# Patient Record
Sex: Female | Born: 2000 | Race: Black or African American | Hispanic: No | Marital: Single | Smoking: Never smoker
Health system: Southern US, Community
[De-identification: ages and names within clinical notes are randomized; demographics above are authoritative.]

## PROBLEM LIST (undated history)

## (undated) DIAGNOSIS — R1030 Lower abdominal pain, unspecified: Secondary | ICD-10-CM

## (undated) DIAGNOSIS — J45909 Unspecified asthma, uncomplicated: Secondary | ICD-10-CM

## (undated) HISTORY — PX: TONSILLECTOMY: SUR1361

## (undated) HISTORY — DX: Lower abdominal pain, unspecified: R10.30

## (undated) HISTORY — PX: TYMPANOSTOMY TUBE PLACEMENT: SHX32

## (undated) HISTORY — PX: APPENDECTOMY: SHX54

---
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
## 2019-01-23 ENCOUNTER — Ambulatory Visit: Payer: Medicaid Other | Admitting: Pediatrics

## 2019-01-26 ENCOUNTER — Encounter: Payer: Self-pay | Admitting: Pediatrics

## 2019-01-26 ENCOUNTER — Other Ambulatory Visit: Payer: Self-pay

## 2019-01-26 ENCOUNTER — Ambulatory Visit (INDEPENDENT_AMBULATORY_CARE_PROVIDER_SITE_OTHER): Payer: Medicaid Other | Admitting: Pediatrics

## 2019-01-26 VITALS — BP 112/72 | HR 73 | Ht 63.15 in | Wt 207.0 lb

## 2019-01-26 DIAGNOSIS — R29898 Other symptoms and signs involving the musculoskeletal system: Secondary | ICD-10-CM | POA: Diagnosis not present

## 2019-01-26 NOTE — Progress Notes (Signed)
Name: Denise Mullins Age: 19 y.o. Sex: female DOB: [birth year] MRN: [id number]  Chief Complaint  Patient presents with  . Neck Pain    Accompanied by mom Rollene Fare     HPI:  This is a 68 y.o. patient who has complaints of neck pain over the last 2 weeks.  She has had gradual onset of moderate severity pain in her neck.  Specifically, she has pain on the left trapezius region.  She states she has radiating pain that goes down her left arm.  Her pain is aggravated at bedtime.  Mom states she did have pain in her back that radiated down her leg, but that seems to have improved/resolved.   She states the pain seems to move around to different parts of her body.  Past Medical History:  Diagnosis Date  . Asthma   . Lower abdominal pain     Past Surgical History:  Procedure Laterality Date  . APPENDECTOMY    . TONSILLECTOMY    . TYMPANOSTOMY TUBE PLACEMENT       Family History  Problem Relation Age of Onset  . Obesity Mother   . Diabetes Father   . Hypertension Father   . Thyroid disease Paternal Grandmother     Current Outpatient Medications on File Prior to Visit  Medication Sig Dispense Refill  . albuterol (PROVENTIL HFA;VENTOLIN HFA) 108 (90 BASE) MCG/ACT inhaler Inhale 2 puffs into the lungs every 4 (four) hours as needed for wheezing or shortness of breath.     . fluticasone (FLONASE) 50 MCG/ACT nasal spray USE 2 SPRAYS INTRANASALLY TO BOTH NOSTRILS ONCE DAILY     No current facility-administered medications on file prior to visit.     ALLERGIES:   Allergies  Allergen Reactions  . Augmentin [Amoxicillin-Pot Clavulanate] Anaphylaxis    Review of Systems  Constitutional: Negative for fever and malaise/fatigue.  HENT: Negative for congestion, ear pain and sore throat.   Eyes: Negative for discharge and redness.  Respiratory: Negative for cough, shortness of breath and wheezing.   Cardiovascular: Negative for chest pain.  Gastrointestinal: Negative for abdominal  pain, diarrhea and vomiting.  Musculoskeletal: Negative for myalgias.  Skin: Negative for rash.  Neurological: Negative for dizziness and headaches.     OBJECTIVE:  VITALS: Blood pressure 112/72, pulse 73, height 5' 3.15" (1.604 m), weight 207 lb (93.9 kg), SpO2 99 %.   Body mass index is 36.49 kg/m.  98 %ile (Z= 2.03) based on CDC (Girls, 2-20 Years) BMI-for-age based on BMI available as of 01/26/2019.  Wt Readings from Last 3 Encounters:  01/26/19 207 lb (93.9 kg) (98 %, Z= 2.05)*  11/21/18 205 lb 3.2 oz (93.1 kg) (98 %, Z= 2.03)*  01/15/15 181 lb (82.1 kg) (97 %, Z= 1.90)*   * Growth percentiles are based on CDC (Girls, 2-20 Years) data.   Ht Readings from Last 3 Encounters:  01/26/19 5' 3.15" (1.604 m) (33 %, Z= -0.44)*  11/21/18 5' 3.19" (1.605 m) (34 %, Z= -0.42)*  01/15/15 5' 2.76" (1.594 m) (35 %, Z= -0.38)*   * Growth percentiles are based on CDC (Girls, 2-20 Years) data.     PHYSICAL EXAM:  General: The patient appears awake, alert, and in no acute distress.  Head: Head is atraumatic/normocephalic.  Ears: TMs are translucent bilaterally without erythema or bulging.  Eyes: No scleral icterus.  No conjunctival injection.  Nose: No nasal congestion noted. No nasal discharge is seen.  Mouth/Throat: Mouth is moist.  Throat without erythema,  lesions, or ulcers.  Neck: Supple without adenopathy.  Chest: Good expansion, symmetric, no deformities noted.  Heart: Regular rate with normal S1-S2.  Lungs: Clear to auscultation bilaterally without wheezes or crackles.  No respiratory distress, work of breathing, or tachypnea noted.  Abdomen: Soft, nontender, nondistended with normal active bowel sounds.  No rebound or guarding noted.  No masses palpated.  No organomegaly noted.  Skin: No rashes noted.  Extremities/Back: Full range of motion with no deficits noted.  Tightness of the left trapezius is noted.  No muscle atrophy is present.  Neurologic exam:  Musculoskeletal exam appropriate for age, normal strength, tone, and reflexes.   IN-HOUSE LABORATORY RESULTS: No results found for any visits on 01/26/19.   ASSESSMENT/PLAN:  1. Other symptoms and signs involving the musculoskeletal system Discussed with the family about this patient's neck pain.  It is likely she is having some muscle spasm, possibly from nerve impingement.  Discussed about ways to improve her pain.  Tylenol may be given, however Motrin is probably a superior choice.  This can be given as directed on the bottle.  Cycling x3 of ice for 20 minutes at a time with immediate subsequent application of a heating pad for 20 minutes may be performed.  Discussed with the family referral will be made to physical therapy.  It may be of limited utility to refer the patient to a spine doctor at this time.  However, if her pain continues, it may be necessary in the future.  Discussed with the family if the patient is not called within 1 week about the referral to physical therapy, the family should call back to the office for an update.  - Physical Therapy    Return if symptoms worsen or fail to improve.

## 2019-01-30 ENCOUNTER — Ambulatory Visit: Payer: Medicaid Other | Admitting: Pediatrics

## 2019-02-13 ENCOUNTER — Encounter: Payer: Self-pay | Admitting: Pediatrics

## 2019-02-16 ENCOUNTER — Ambulatory Visit (HOSPITAL_COMMUNITY): Payer: Medicaid Other | Attending: Pediatrics | Admitting: Physical Therapy

## 2019-03-17 DIAGNOSIS — Z03818 Encounter for observation for suspected exposure to other biological agents ruled out: Secondary | ICD-10-CM | POA: Diagnosis not present

## 2019-03-20 ENCOUNTER — Telehealth: Payer: Self-pay | Admitting: Pediatrics

## 2019-03-20 NOTE — Telephone Encounter (Signed)
Needs a graduation letter.  I'm seeing her on a couple of days.

## 2019-03-21 ENCOUNTER — Encounter: Payer: Self-pay | Admitting: Pediatrics

## 2019-03-21 NOTE — Telephone Encounter (Signed)
Graduation letter ready and at check-in for hand delivery at 03/23/19 appointment.

## 2019-03-23 ENCOUNTER — Ambulatory Visit: Payer: Medicaid Other | Admitting: Pediatrics

## 2019-05-24 DIAGNOSIS — Z13220 Encounter for screening for lipoid disorders: Secondary | ICD-10-CM | POA: Diagnosis not present

## 2019-05-24 DIAGNOSIS — R635 Abnormal weight gain: Secondary | ICD-10-CM | POA: Diagnosis not present

## 2019-05-24 DIAGNOSIS — N926 Irregular menstruation, unspecified: Secondary | ICD-10-CM | POA: Diagnosis not present

## 2019-05-24 DIAGNOSIS — Z68.41 Body mass index (BMI) pediatric, greater than or equal to 95th percentile for age: Secondary | ICD-10-CM | POA: Diagnosis not present

## 2019-05-24 DIAGNOSIS — Z6837 Body mass index (BMI) 37.0-37.9, adult: Secondary | ICD-10-CM | POA: Diagnosis not present

## 2019-05-24 DIAGNOSIS — Z0000 Encounter for general adult medical examination without abnormal findings: Secondary | ICD-10-CM | POA: Diagnosis not present

## 2019-07-11 DIAGNOSIS — Z23 Encounter for immunization: Secondary | ICD-10-CM | POA: Diagnosis not present

## 2019-07-17 ENCOUNTER — Encounter: Payer: Medicaid Other | Admitting: Adult Health

## 2019-08-08 DIAGNOSIS — Z23 Encounter for immunization: Secondary | ICD-10-CM | POA: Diagnosis not present

## 2019-12-28 DIAGNOSIS — N760 Acute vaginitis: Secondary | ICD-10-CM | POA: Diagnosis not present

## 2020-01-16 ENCOUNTER — Encounter (INDEPENDENT_AMBULATORY_CARE_PROVIDER_SITE_OTHER): Payer: Self-pay | Admitting: Student in an Organized Health Care Education/Training Program

## 2020-02-12 DIAGNOSIS — N926 Irregular menstruation, unspecified: Secondary | ICD-10-CM | POA: Diagnosis not present

## 2020-02-12 DIAGNOSIS — R350 Frequency of micturition: Secondary | ICD-10-CM | POA: Diagnosis not present

## 2020-03-02 DIAGNOSIS — E869 Volume depletion, unspecified: Secondary | ICD-10-CM | POA: Diagnosis not present

## 2020-03-02 DIAGNOSIS — J45909 Unspecified asthma, uncomplicated: Secondary | ICD-10-CM | POA: Diagnosis not present

## 2020-03-02 DIAGNOSIS — R109 Unspecified abdominal pain: Secondary | ICD-10-CM | POA: Diagnosis not present

## 2020-03-02 DIAGNOSIS — R1032 Left lower quadrant pain: Secondary | ICD-10-CM | POA: Diagnosis not present

## 2020-03-03 DIAGNOSIS — R109 Unspecified abdominal pain: Secondary | ICD-10-CM | POA: Diagnosis not present

## 2020-03-05 ENCOUNTER — Telehealth: Payer: Self-pay

## 2020-03-05 NOTE — Telephone Encounter (Signed)
Transition Care Management Follow-up Telephone Call  Date of discharge and from where: 03/03/2020 [facility] ED  How have you been since you were released from the hospital? Spoke with mother Fadumo Heng. Stated patient is feeling better and back in school.    Confirmed PCP is at [facility] Patient mother hung up mid-sentence. Attempted to call back no answer.

## 2020-03-18 DIAGNOSIS — Z202 Contact with and (suspected) exposure to infections with a predominantly sexual mode of transmission: Secondary | ICD-10-CM | POA: Diagnosis not present

## 2020-05-16 DIAGNOSIS — A6004 Herpesviral vulvovaginitis: Secondary | ICD-10-CM | POA: Diagnosis not present

## 2020-05-16 DIAGNOSIS — Z3202 Encounter for pregnancy test, result negative: Secondary | ICD-10-CM | POA: Diagnosis not present

## 2020-05-16 DIAGNOSIS — N760 Acute vaginitis: Secondary | ICD-10-CM | POA: Diagnosis not present

## 2020-05-16 DIAGNOSIS — B9689 Other specified bacterial agents as the cause of diseases classified elsewhere: Secondary | ICD-10-CM | POA: Diagnosis not present

## 2020-05-16 DIAGNOSIS — Z113 Encounter for screening for infections with a predominantly sexual mode of transmission: Secondary | ICD-10-CM | POA: Diagnosis not present

## 2020-05-22 ENCOUNTER — Other Ambulatory Visit: Payer: Self-pay

## 2020-05-22 ENCOUNTER — Other Ambulatory Visit (INDEPENDENT_AMBULATORY_CARE_PROVIDER_SITE_OTHER): Payer: Medicaid Other

## 2020-05-22 DIAGNOSIS — Z202 Contact with and (suspected) exposure to infections with a predominantly sexual mode of transmission: Secondary | ICD-10-CM

## 2020-05-22 NOTE — Progress Notes (Addendum)
   NURSE VISIT- VAGINITIS/STD/POC  SUBJECTIVE:  Denise Mullins is a 20 y.o. No obstetric history on file. GYN patientfemale here for a vaginal swab for STD screen.  She reports the following symptoms: bumps for 1 week. Denies abnormal vaginal bleeding, significant pelvic pain, fever, or UTI symptoms.  OBJECTIVE:  There were no vitals taken for this visit.  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 Discussed w/ nurse. States pt doesn't have any current bumps/vulvar lesions. Was seen recently at hospital and was told she had herpes and pt wanted bloodwork to be checked.  Cheral Marker, [place] 05/22/2020 5:16 PM

## 2020-05-23 DIAGNOSIS — J069 Acute upper respiratory infection, unspecified: Secondary | ICD-10-CM | POA: Diagnosis not present

## 2020-05-23 DIAGNOSIS — R42 Dizziness and giddiness: Secondary | ICD-10-CM | POA: Diagnosis not present

## 2020-05-23 DIAGNOSIS — J309 Allergic rhinitis, unspecified: Secondary | ICD-10-CM | POA: Diagnosis not present

## 2020-05-23 DIAGNOSIS — R059 Cough, unspecified: Secondary | ICD-10-CM | POA: Diagnosis not present

## 2020-05-23 DIAGNOSIS — B349 Viral infection, unspecified: Secondary | ICD-10-CM | POA: Diagnosis not present

## 2020-05-23 DIAGNOSIS — J029 Acute pharyngitis, unspecified: Secondary | ICD-10-CM | POA: Diagnosis not present

## 2020-05-23 DIAGNOSIS — R0600 Dyspnea, unspecified: Secondary | ICD-10-CM | POA: Diagnosis not present

## 2020-05-23 LAB — HSV(HERPES SIMPLEX VRS) I + II AB-IGG
HSV 1 Glycoprotein G Ab, IgG: <0.91 index (ref 0.00–0.90)
HSV 2 IgG, Type Spec: 9.4 index — ABNORMAL HIGH (ref 0.00–0.90)

## 2020-05-27 DIAGNOSIS — Z0000 Encounter for general adult medical examination without abnormal findings: Secondary | ICD-10-CM | POA: Diagnosis not present

## 2020-05-27 DIAGNOSIS — Z130 Encounter for screening for diseases of the blood and blood-forming organs and certain disorders involving the immune mechanism: Secondary | ICD-10-CM | POA: Diagnosis not present

## 2020-05-27 DIAGNOSIS — Z6838 Body mass index (BMI) 38.0-38.9, adult: Secondary | ICD-10-CM | POA: Diagnosis not present

## 2020-05-27 DIAGNOSIS — B009 Herpesviral infection, unspecified: Secondary | ICD-10-CM | POA: Diagnosis not present

## 2020-05-27 DIAGNOSIS — Z13220 Encounter for screening for lipoid disorders: Secondary | ICD-10-CM | POA: Diagnosis not present

## 2020-05-27 DIAGNOSIS — R635 Abnormal weight gain: Secondary | ICD-10-CM | POA: Diagnosis not present

## 2020-07-13 DIAGNOSIS — E559 Vitamin D deficiency, unspecified: Secondary | ICD-10-CM | POA: Diagnosis not present

## 2020-07-13 DIAGNOSIS — R635 Abnormal weight gain: Secondary | ICD-10-CM | POA: Diagnosis not present

## 2020-07-13 DIAGNOSIS — Z79899 Other long term (current) drug therapy: Secondary | ICD-10-CM | POA: Diagnosis not present

## 2020-07-13 DIAGNOSIS — R0602 Shortness of breath: Secondary | ICD-10-CM | POA: Diagnosis not present

## 2020-07-13 DIAGNOSIS — R5383 Other fatigue: Secondary | ICD-10-CM | POA: Diagnosis not present

## 2020-08-19 DIAGNOSIS — R635 Abnormal weight gain: Secondary | ICD-10-CM | POA: Diagnosis not present

## 2020-08-19 DIAGNOSIS — E559 Vitamin D deficiency, unspecified: Secondary | ICD-10-CM | POA: Diagnosis not present

## 2020-08-19 DIAGNOSIS — Z79899 Other long term (current) drug therapy: Secondary | ICD-10-CM | POA: Diagnosis not present

## 2020-08-23 DIAGNOSIS — Z20822 Contact with and (suspected) exposure to covid-19: Secondary | ICD-10-CM | POA: Diagnosis not present

## 2020-09-05 DIAGNOSIS — R635 Abnormal weight gain: Secondary | ICD-10-CM | POA: Diagnosis not present

## 2020-09-05 DIAGNOSIS — Z79899 Other long term (current) drug therapy: Secondary | ICD-10-CM | POA: Diagnosis not present

## 2020-09-09 DIAGNOSIS — Z79899 Other long term (current) drug therapy: Secondary | ICD-10-CM | POA: Diagnosis not present

## 2020-09-17 DIAGNOSIS — Z113 Encounter for screening for infections with a predominantly sexual mode of transmission: Secondary | ICD-10-CM | POA: Diagnosis not present

## 2020-10-11 DIAGNOSIS — Z3200 Encounter for pregnancy test, result unknown: Secondary | ICD-10-CM | POA: Diagnosis not present

## 2020-10-11 DIAGNOSIS — Z79899 Other long term (current) drug therapy: Secondary | ICD-10-CM | POA: Diagnosis not present

## 2020-10-11 DIAGNOSIS — F411 Generalized anxiety disorder: Secondary | ICD-10-CM | POA: Diagnosis not present

## 2020-10-11 DIAGNOSIS — R635 Abnormal weight gain: Secondary | ICD-10-CM | POA: Diagnosis not present

## 2020-10-15 DIAGNOSIS — Z79899 Other long term (current) drug therapy: Secondary | ICD-10-CM | POA: Diagnosis not present

## 2020-11-03 DIAGNOSIS — Z880 Allergy status to penicillin: Secondary | ICD-10-CM | POA: Diagnosis not present

## 2020-11-03 DIAGNOSIS — R102 Pelvic and perineal pain: Secondary | ICD-10-CM | POA: Diagnosis not present

## 2020-11-03 DIAGNOSIS — R509 Fever, unspecified: Secondary | ICD-10-CM | POA: Diagnosis not present

## 2020-11-03 DIAGNOSIS — R5383 Other fatigue: Secondary | ICD-10-CM | POA: Diagnosis not present

## 2020-11-03 DIAGNOSIS — B349 Viral infection, unspecified: Secondary | ICD-10-CM | POA: Diagnosis not present

## 2020-11-03 DIAGNOSIS — J029 Acute pharyngitis, unspecified: Secondary | ICD-10-CM | POA: Diagnosis not present

## 2020-11-03 DIAGNOSIS — Z79899 Other long term (current) drug therapy: Secondary | ICD-10-CM | POA: Diagnosis not present

## 2020-11-03 DIAGNOSIS — Z20822 Contact with and (suspected) exposure to covid-19: Secondary | ICD-10-CM | POA: Diagnosis not present

## 2020-11-03 DIAGNOSIS — M791 Myalgia, unspecified site: Secondary | ICD-10-CM | POA: Diagnosis not present

## 2020-11-04 DIAGNOSIS — B084 Enteroviral vesicular stomatitis with exanthem: Secondary | ICD-10-CM | POA: Diagnosis not present

## 2020-11-04 DIAGNOSIS — R070 Pain in throat: Secondary | ICD-10-CM | POA: Diagnosis not present

## 2020-11-18 DIAGNOSIS — N912 Amenorrhea, unspecified: Secondary | ICD-10-CM | POA: Diagnosis not present

## 2020-11-28 ENCOUNTER — Ambulatory Visit: Payer: Medicaid Other | Admitting: Adult Health

## 2020-12-05 ENCOUNTER — Encounter: Payer: Self-pay | Admitting: Adult Health

## 2020-12-05 ENCOUNTER — Other Ambulatory Visit: Payer: Self-pay

## 2020-12-05 ENCOUNTER — Ambulatory Visit (INDEPENDENT_AMBULATORY_CARE_PROVIDER_SITE_OTHER): Payer: Medicaid Other | Admitting: Adult Health

## 2020-12-05 VITALS — BP 139/83 | HR 76 | Ht 63.5 in | Wt 205.0 lb

## 2020-12-05 DIAGNOSIS — Z3200 Encounter for pregnancy test, result unknown: Secondary | ICD-10-CM | POA: Insufficient documentation

## 2020-12-05 DIAGNOSIS — Z319 Encounter for procreative management, unspecified: Secondary | ICD-10-CM

## 2020-12-05 DIAGNOSIS — Z113 Encounter for screening for infections with a predominantly sexual mode of transmission: Secondary | ICD-10-CM | POA: Diagnosis not present

## 2020-12-05 LAB — POCT URINE PREGNANCY: Preg Test, Ur: NEGATIVE

## 2020-12-05 MED ORDER — PRENATAL PLUS 27-1 MG PO TABS: 1.0000 | ORAL_TABLET | Freq: Every day | ORAL | 12 refills | Status: AC

## 2020-12-05 NOTE — Addendum Note (Signed)
Addended by: Annamarie Dawley on: 12/05/2020 11:15 AM   Modules accepted: Orders

## 2020-12-05 NOTE — Progress Notes (Signed)
Subjective:     Patient ID: Denise Mullins, female   DOB: April 29, 2000, 20 y.o.   MRN: [id number]  HPI Denise Mullins is a 20 year old black female,single, G0P0, in for UPT, periods was late and had +then negative HPT. PCP is [facility] in [place].   Review of Systems Period was late and had +HPT then Negative HPT  Periods usually regular  Reviewed past medical,surgical, social and family history. Reviewed medications and allergies.     Objective:   Physical Exam BP 139/83 (BP Location: Right Arm, Patient Position: Sitting, Cuff Size: Normal)   Pulse 76   Ht 5' 3.5" (1.613 m)   Wt 205 lb (93 kg)   LMP 11/14/2020 (Exact Date)   BMI 35.74 kg/m  UPT is negative.Skin warm and dry. Neck: mid line trachea, normal thyroid, good ROM, no lymphadenopathy noted. Lungs: clear to ausculation bilaterally. Cardiovascular: regular rate and rhythm.    AA is 1 Fall risk is low Depression screen PHQ 2/9 12/05/2020  Decreased Interest 1  Down, Depressed, Hopeless 0  PHQ - 2 Score 1  Altered sleeping 1  Tired, decreased energy 1  Change in appetite 1  Feeling bad or failure about yourself  0  Trouble concentrating 1  Moving slowly or fidgety/restless 1  Suicidal thoughts 0  PHQ-9 Score 6    GAD 7 : Generalized Anxiety Score 12/05/2020  Nervous, Anxious, on Edge 1  Control/stop worrying 0  Worry too much - different things 1  Trouble relaxing 1  Restless 0  Easily annoyed or irritable 1  Afraid - awful might happen 0  Total GAD 7 Score 4    Upstream - 12/05/20 0953       Pregnancy Intention Screening   Does the patient want to become pregnant in the next year? Unsure    Does the patient's partner want to become pregnant in the next year? Unsure    Would the patient like to discuss contraceptive options today? No      Contraception Wrap Up   Current Method No Contraceptive Precautions    End Method No Contraception Precautions    Contraception Counseling Provided No               Upstream - 12/05/20 0953       Pregnancy Intention Screening   Does the patient want to become pregnant in the next year? Unsure    Does the patient's partner want to become pregnant in the next year? Unsure    Would the patient like to discuss contraceptive options today? No      Contraception Wrap Up   Current Method No Contraceptive Precautions    End Method No Contraception Precautions    Contraception Counseling Provided No             . Assessment:     1. Possible pregnancy,UPT negtive  Check QHCG   2. Patient desires pregnancy Will rx prenatal vitamins Meds ordered this encounter  Medications   prenatal vitamin w/FE, FA (PRENATAL 1 + 1) 27-1 MG TABS tablet    Sig: Take 1 tablet by mouth daily at 12 noon.    Dispense:  30 tablet    Refill:  12    Order Specific Question:   Supervising Provider    Answer:   Despina Hidden, LUTHER H [[id number]]     3. Screening examination for STD (sexually transmitted disease) Urine sent for GC/CHL    Plan:     Follow up prn  Get pap at 21

## 2020-12-06 LAB — BETA HCG QUANT (REF LAB): hCG Quant: <1 m[IU]/mL

## 2020-12-07 LAB — GC/CHLAMYDIA PROBE AMP
Chlamydia trachomatis, NAA: NEGATIVE
Neisseria Gonorrhoeae by PCR: NEGATIVE

## 2020-12-09 ENCOUNTER — Telehealth: Payer: Self-pay | Admitting: Adult Health

## 2020-12-09 NOTE — Telephone Encounter (Signed)
Patient wants results of her labs

## 2020-12-09 NOTE — Telephone Encounter (Signed)
Returned pt's call. Two identifiers used. Pt having trouble with wifi for Mychart access. Relayed test results per pt request. Pt confirmed understanding.

## 2021-01-10 DIAGNOSIS — K219 Gastro-esophageal reflux disease without esophagitis: Secondary | ICD-10-CM | POA: Diagnosis not present

## 2021-01-10 DIAGNOSIS — B009 Herpesviral infection, unspecified: Secondary | ICD-10-CM | POA: Diagnosis not present

## 2021-02-17 DIAGNOSIS — Z113 Encounter for screening for infections with a predominantly sexual mode of transmission: Secondary | ICD-10-CM | POA: Diagnosis not present

## 2021-02-17 DIAGNOSIS — Z0000 Encounter for general adult medical examination without abnormal findings: Secondary | ICD-10-CM | POA: Diagnosis not present

## 2021-02-17 DIAGNOSIS — Z01419 Encounter for gynecological examination (general) (routine) without abnormal findings: Secondary | ICD-10-CM | POA: Diagnosis not present

## 2021-04-23 DIAGNOSIS — R2243 Localized swelling, mass and lump, lower limb, bilateral: Secondary | ICD-10-CM | POA: Diagnosis not present

## 2021-04-23 DIAGNOSIS — H9201 Otalgia, right ear: Secondary | ICD-10-CM | POA: Diagnosis not present

## 2021-04-23 DIAGNOSIS — N898 Other specified noninflammatory disorders of vagina: Secondary | ICD-10-CM | POA: Diagnosis not present

## 2021-04-30 DIAGNOSIS — Z20822 Contact with and (suspected) exposure to covid-19: Secondary | ICD-10-CM | POA: Diagnosis not present

## 2021-04-30 DIAGNOSIS — R519 Headache, unspecified: Secondary | ICD-10-CM | POA: Diagnosis not present

## 2021-04-30 DIAGNOSIS — A084 Viral intestinal infection, unspecified: Secondary | ICD-10-CM | POA: Diagnosis not present

## 2021-07-31 DIAGNOSIS — J069 Acute upper respiratory infection, unspecified: Secondary | ICD-10-CM | POA: Diagnosis not present

## 2022-01-20 DIAGNOSIS — N912 Amenorrhea, unspecified: Secondary | ICD-10-CM | POA: Diagnosis not present

## 2022-01-20 DIAGNOSIS — Z3689 Encounter for other specified antenatal screening: Secondary | ICD-10-CM | POA: Diagnosis not present

## 2022-01-20 DIAGNOSIS — B009 Herpesviral infection, unspecified: Secondary | ICD-10-CM | POA: Diagnosis not present

## 2022-01-20 DIAGNOSIS — Z3201 Encounter for pregnancy test, result positive: Secondary | ICD-10-CM | POA: Diagnosis not present

## 2022-02-17 DIAGNOSIS — Z36 Encounter for antenatal screening for chromosomal anomalies: Secondary | ICD-10-CM | POA: Diagnosis not present

## 2022-02-17 DIAGNOSIS — Z3689 Encounter for other specified antenatal screening: Secondary | ICD-10-CM | POA: Diagnosis not present

## 2022-02-17 DIAGNOSIS — O09891 Supervision of other high risk pregnancies, first trimester: Secondary | ICD-10-CM | POA: Diagnosis not present

## 2022-03-17 DIAGNOSIS — Z3689 Encounter for other specified antenatal screening: Secondary | ICD-10-CM | POA: Diagnosis not present

## 2022-04-14 DIAGNOSIS — Z3A19 19 weeks gestation of pregnancy: Secondary | ICD-10-CM | POA: Diagnosis not present

## 2022-04-14 DIAGNOSIS — Z3689 Encounter for other specified antenatal screening: Secondary | ICD-10-CM | POA: Diagnosis not present

## 2022-05-17 DIAGNOSIS — Z3A24 24 weeks gestation of pregnancy: Secondary | ICD-10-CM | POA: Diagnosis not present

## 2022-05-17 DIAGNOSIS — O368120 Decreased fetal movements, second trimester, not applicable or unspecified: Secondary | ICD-10-CM | POA: Diagnosis not present

## 2022-06-09 DIAGNOSIS — Z23 Encounter for immunization: Secondary | ICD-10-CM | POA: Diagnosis not present

## 2022-06-09 DIAGNOSIS — Z3689 Encounter for other specified antenatal screening: Secondary | ICD-10-CM | POA: Diagnosis not present

## 2022-07-06 DIAGNOSIS — O99891 Other specified diseases and conditions complicating pregnancy: Secondary | ICD-10-CM | POA: Diagnosis not present

## 2022-07-06 DIAGNOSIS — Z3A31 31 weeks gestation of pregnancy: Secondary | ICD-10-CM | POA: Diagnosis not present

## 2022-07-06 DIAGNOSIS — M79659 Pain in unspecified thigh: Secondary | ICD-10-CM | POA: Diagnosis not present

## 2022-07-27 DIAGNOSIS — Z3490 Encounter for supervision of normal pregnancy, unspecified, unspecified trimester: Secondary | ICD-10-CM | POA: Diagnosis not present

## 2022-08-06 DIAGNOSIS — O365930 Maternal care for other known or suspected poor fetal growth, third trimester, not applicable or unspecified: Secondary | ICD-10-CM | POA: Diagnosis not present

## 2022-08-06 DIAGNOSIS — O99213 Obesity complicating pregnancy, third trimester: Secondary | ICD-10-CM | POA: Diagnosis not present

## 2022-08-06 DIAGNOSIS — O26843 Uterine size-date discrepancy, third trimester: Secondary | ICD-10-CM | POA: Diagnosis not present

## 2022-08-06 DIAGNOSIS — Z3A35 35 weeks gestation of pregnancy: Secondary | ICD-10-CM | POA: Diagnosis not present

## 2022-08-12 DIAGNOSIS — Z3685 Encounter for antenatal screening for Streptococcus B: Secondary | ICD-10-CM | POA: Diagnosis not present

## 2022-08-12 DIAGNOSIS — Z3689 Encounter for other specified antenatal screening: Secondary | ICD-10-CM | POA: Diagnosis not present

## 2022-08-31 DIAGNOSIS — Z3A39 39 weeks gestation of pregnancy: Secondary | ICD-10-CM | POA: Diagnosis not present

## 2022-08-31 DIAGNOSIS — O26893 Other specified pregnancy related conditions, third trimester: Secondary | ICD-10-CM | POA: Diagnosis not present

## 2022-09-01 DIAGNOSIS — Z3A39 39 weeks gestation of pregnancy: Secondary | ICD-10-CM | POA: Diagnosis not present

## 2022-09-01 DIAGNOSIS — O99214 Obesity complicating childbirth: Secondary | ICD-10-CM | POA: Diagnosis not present

## 2022-09-02 DIAGNOSIS — Z3A39 39 weeks gestation of pregnancy: Secondary | ICD-10-CM | POA: Diagnosis not present

## 2022-12-02 DIAGNOSIS — Z13220 Encounter for screening for lipoid disorders: Secondary | ICD-10-CM | POA: Diagnosis not present

## 2022-12-02 DIAGNOSIS — F411 Generalized anxiety disorder: Secondary | ICD-10-CM | POA: Diagnosis not present

## 2022-12-02 DIAGNOSIS — E6609 Other obesity due to excess calories: Secondary | ICD-10-CM | POA: Diagnosis not present

## 2022-12-02 DIAGNOSIS — R1011 Right upper quadrant pain: Secondary | ICD-10-CM | POA: Diagnosis not present

## 2022-12-02 DIAGNOSIS — K219 Gastro-esophageal reflux disease without esophagitis: Secondary | ICD-10-CM | POA: Diagnosis not present

## 2022-12-02 DIAGNOSIS — E66811 Obesity, class 1: Secondary | ICD-10-CM | POA: Diagnosis not present

## 2022-12-02 DIAGNOSIS — Z130 Encounter for screening for diseases of the blood and blood-forming organs and certain disorders involving the immune mechanism: Secondary | ICD-10-CM | POA: Diagnosis not present

## 2022-12-02 DIAGNOSIS — Z0000 Encounter for general adult medical examination without abnormal findings: Secondary | ICD-10-CM | POA: Diagnosis not present

## 2022-12-02 DIAGNOSIS — Z6834 Body mass index (BMI) 34.0-34.9, adult: Secondary | ICD-10-CM | POA: Diagnosis not present

## 2022-12-09 DIAGNOSIS — J208 Acute bronchitis due to other specified organisms: Secondary | ICD-10-CM | POA: Diagnosis not present

## 2022-12-09 DIAGNOSIS — B9689 Other specified bacterial agents as the cause of diseases classified elsewhere: Secondary | ICD-10-CM | POA: Diagnosis not present

## 2022-12-18 DIAGNOSIS — J029 Acute pharyngitis, unspecified: Secondary | ICD-10-CM | POA: Diagnosis not present

## 2023-03-19 DIAGNOSIS — E6609 Other obesity due to excess calories: Secondary | ICD-10-CM | POA: Diagnosis not present

## 2023-03-19 DIAGNOSIS — E66811 Obesity, class 1: Secondary | ICD-10-CM | POA: Diagnosis not present

## 2023-03-19 DIAGNOSIS — Z6834 Body mass index (BMI) 34.0-34.9, adult: Secondary | ICD-10-CM | POA: Diagnosis not present

## 2023-03-19 DIAGNOSIS — R519 Headache, unspecified: Secondary | ICD-10-CM | POA: Diagnosis not present

## 2023-03-19 DIAGNOSIS — H9201 Otalgia, right ear: Secondary | ICD-10-CM | POA: Diagnosis not present

## 2023-03-22 DIAGNOSIS — F17200 Nicotine dependence, unspecified, uncomplicated: Secondary | ICD-10-CM | POA: Diagnosis not present

## 2023-04-19 DIAGNOSIS — J101 Influenza due to other identified influenza virus with other respiratory manifestations: Secondary | ICD-10-CM | POA: Diagnosis not present

## 2023-04-20 DIAGNOSIS — Z7251 High risk heterosexual behavior: Secondary | ICD-10-CM | POA: Diagnosis not present

## 2023-05-18 DIAGNOSIS — E6609 Other obesity due to excess calories: Secondary | ICD-10-CM | POA: Diagnosis not present

## 2023-05-18 DIAGNOSIS — Z6834 Body mass index (BMI) 34.0-34.9, adult: Secondary | ICD-10-CM | POA: Diagnosis not present

## 2023-05-18 DIAGNOSIS — E66811 Obesity, class 1: Secondary | ICD-10-CM | POA: Diagnosis not present

## 2023-06-01 DIAGNOSIS — E66812 Obesity, class 2: Secondary | ICD-10-CM | POA: Diagnosis not present

## 2023-06-01 DIAGNOSIS — Z6835 Body mass index (BMI) 35.0-35.9, adult: Secondary | ICD-10-CM | POA: Diagnosis not present

## 2023-11-08 ENCOUNTER — Ambulatory Visit
Admission: EM | Admit: 2023-11-08 | Discharge: 2023-11-08 | Disposition: A | Discharge status: Home / Self Care | Attending: Family Medicine | Admitting: Family Medicine

## 2023-11-08 DIAGNOSIS — J069 Acute upper respiratory infection, unspecified: Secondary | ICD-10-CM | POA: Diagnosis not present

## 2023-11-08 DIAGNOSIS — J4521 Mild intermittent asthma with (acute) exacerbation: Secondary | ICD-10-CM | POA: Diagnosis not present

## 2023-11-08 LAB — POC COVID19/FLU A&B COMBO
Covid Antigen, POC: NEGATIVE
Influenza A Antigen, POC: NEGATIVE
Influenza B Antigen, POC: NEGATIVE

## 2023-11-08 LAB — POCT RAPID STREP A (OFFICE): Rapid Strep A Screen: NEGATIVE

## 2023-11-08 MED ORDER — AZELASTINE HCL 0.1 % NA SOLN: 1.0000 | Freq: Two times a day (BID) | NASAL | 0 refills | Status: AC

## 2023-11-08 MED ORDER — ALBUTEROL SULFATE HFA 108 (90 BASE) MCG/ACT IN AERS: 2.0000 | INHALATION_SPRAY | RESPIRATORY_TRACT | 0 refills | Status: AC | PRN

## 2023-11-08 MED ORDER — PROMETHAZINE-DM 6.25-15 MG/5ML PO SYRP: 5.0000 mL | ORAL_SOLUTION | Freq: Four times a day (QID) | ORAL | 0 refills | Status: AC | PRN

## 2023-11-08 NOTE — ED Provider Notes (Signed)
 [facility]    CSN: [id number] Arrival date & time: 11/08/23  1000      History   Chief Complaint Chief Complaint  Patient presents with   Cough   Sore Throat    HPI Denise Mullins is a 23 y.o. female.   Patient presenting today with 2-day history of cough, runny nose, congestion, chest tightness, sore throat.  Denies fever, chills, body aches, shortness of breath, abdominal pain, vomiting, diarrhea.  So far not trying anything over-the-counter for symptoms.  History of asthma, does not currently have an albuterol inhaler at home.    Past Medical History:  Diagnosis Date   Asthma    Lower abdominal pain     Patient Active Problem List   Diagnosis Date Noted   Patient desires pregnancy 12/05/2020   Possible pregnancy 12/05/2020   Screening examination for STD (sexually transmitted disease) 12/05/2020   Atopic rhinitis 12/28/2018   Gastroesophageal reflux disease 12/28/2018   Constipated 12/28/2018   Abnormal thyroid  function test 01/15/2015   Knee pain, left 05/11/2013   Weakness of left leg 05/11/2013   Difficulty walking 05/11/2013   Lower abdominal pain     Past Surgical History:  Procedure Laterality Date   APPENDECTOMY     TONSILLECTOMY     TYMPANOSTOMY TUBE PLACEMENT      OB History     Gravida  0   Para  0   Term  0   Preterm  0   AB  0   Living  0      SAB  0   IAB  0   Ectopic  0   Multiple  0   Live Births  0            Home Medications    Prior to Admission medications   Medication Sig Start Date End Date Taking? Authorizing Provider  azelastine (ASTELIN) 0.1 % nasal spray Place 1 spray into both nostrils 2 (two) times daily. Use in each nostril as directed 11/08/23  Yes Stuart Vernell Norris, PA-C  promethazine-dextromethorphan (PROMETHAZINE-DM) 6.25-15 MG/5ML syrup Take 5 mLs by mouth 4 (four) times daily as needed. 11/08/23  Yes Stuart Vernell Norris, PA-C  albuterol (VENTOLIN HFA) 108 (90 Base)  MCG/ACT inhaler Inhale 2 puffs into the lungs every 4 (four) hours as needed for wheezing or shortness of breath. 11/08/23   Stuart Vernell Norris, PA-C  fluticasone [facility]) 50 MCG/ACT nasal spray USE 2 SPRAYS INTRANASALLY TO BOTH NOSTRILS ONCE DAILY Patient not taking: Reported on 12/05/2020 10/26/17   [provider]  prenatal vitamin w/FE, FA (PRENATAL 1 + 1) 27-1 MG TABS tablet Take 1 tablet by mouth daily at 12 noon. 12/05/20   Signa Delon LABOR, NP    Family History Family History  Problem Relation Age of Onset   Obesity Mother    Diabetes Father    Hypertension Father    Thyroid  disease Paternal Grandmother     Social History Social History   Tobacco Use   Smoking status: Never   Smokeless tobacco: Never  Vaping Use   Vaping status: Never Used  Substance Use Topics   Alcohol use: Never   Drug use: Never     Allergies   Augmentin [amoxicillin-pot clavulanate]   Review of Systems Review of Systems PER HPI  Physical Exam Triage Vital Signs ED Triage Vitals  Encounter Vitals Group     BP 11/08/23 1046 120/79     Girls Systolic BP Percentile --  Girls Diastolic BP Percentile --      Boys Systolic BP Percentile --      Boys Diastolic BP Percentile --      Pulse Rate 11/08/23 1046 81     Resp 11/08/23 1046 16     Temp 11/08/23 1046 97.9 F (36.6 C)     Temp Source 11/08/23 1046 Oral     SpO2 11/08/23 1046 97 %     Weight --      Height --      Head Circumference --      Peak Flow --      Pain Score 11/08/23 1048 0     Pain Loc --      Pain Education --      Exclude from Growth Chart --    No data found.  Updated Vital Signs BP 120/79 (BP Location: Right Arm)   Pulse 81   Temp 97.9 F (36.6 C) (Oral)   Resp 16   LMP 10/12/2023 (Approximate)   SpO2 97%   Visual Acuity Right Eye Distance:   Left Eye Distance:   Bilateral Distance:    Right Eye Near:   Left Eye Near:    Bilateral Near:     Physical Exam Vitals and nursing  note reviewed.  Constitutional:      Appearance: Normal appearance.  HENT:     Head: Atraumatic.     Right Ear: Tympanic membrane and external ear normal.     Left Ear: Tympanic membrane and external ear normal.     Nose: Rhinorrhea present.     Mouth/Throat:     Mouth: Mucous membranes are moist.     Pharynx: Posterior oropharyngeal erythema present.  Eyes:     Extraocular Movements: Extraocular movements intact.     Conjunctiva/sclera: Conjunctivae normal.  Cardiovascular:     Rate and Rhythm: Normal rate and regular rhythm.     Heart sounds: Normal heart sounds.  Pulmonary:     Effort: Pulmonary effort is normal.     Breath sounds: Normal breath sounds. No wheezing or rales.  Musculoskeletal:        General: Normal range of motion.     Cervical back: Normal range of motion and neck supple.  Skin:    General: Skin is warm and dry.  Neurological:     Mental Status: She is alert and oriented to person, place, and time.  Psychiatric:        Mood and Affect: Mood normal.        Thought Content: Thought content normal.      UC Treatments / Results  Labs (all labs ordered are listed, but only abnormal results are displayed) Labs Reviewed  POC COVID19/FLU A&B COMBO  POCT RAPID STREP A (OFFICE)    EKG   Radiology No results found.  Procedures Procedures (including critical care time)  Medications Ordered in UC Medications - No data to display  Initial Impression / Assessment and Plan / UC Course  I have reviewed the triage vital signs and the nursing notes.  Pertinent labs & imaging results that were available during my care of the patient were reviewed by me and considered in my medical decision making (see chart for details).     Vitals and exam very reassuring today, rapid strep, COVID and flu all negative.  Suspect viral respiratory infection causing an asthma exacerbation.  Will treat with albuterol inhaler, Phenergan DM, Astelin, supportive  over-the-counter medications and home care.  Return for  worsening symptoms.  Work note given.  Final Clinical Impressions(s) / UC Diagnoses   Final diagnoses:  Viral URI with cough  Mild intermittent asthma with acute exacerbation   Discharge Instructions   None    ED Prescriptions     Medication Sig Dispense Auth. Provider   albuterol (VENTOLIN HFA) 108 (90 Base) MCG/ACT inhaler Inhale 2 puffs into the lungs every 4 (four) hours as needed for wheezing or shortness of breath. 18 g Stuart Millman [place], PA-C   promethazine-dextromethorphan (PROMETHAZINE-DM) 6.25-15 MG/5ML syrup Take 5 mLs by mouth 4 (four) times daily as needed. 100 mL Stuart Millman Norris, PA-C   azelastine (ASTELIN) 0.1 % nasal spray Place 1 spray into both nostrils 2 (two) times daily. Use in each nostril as directed 30 mL Stuart Millman Norris, PA-C      PDMP not reviewed this encounter.   Stuart Millman Norris, PA-C 11/08/23 1203

## 2023-11-08 NOTE — ED Triage Notes (Signed)
 Cough, runny nose, congestion, chest tightness, sore throat x 2 days.
